# Patient Record
Sex: Male | Born: 1960 | Race: Black or African American | Hispanic: No | Marital: Single
Health system: Southern US, Academic
[De-identification: ages and names within clinical notes are randomized; demographics above are authoritative.]

## PROBLEM LIST (undated history)

## (undated) ENCOUNTER — Encounter: Attending: Family | Primary: Family

## (undated) ENCOUNTER — Ambulatory Visit: Attending: Family | Primary: Family

## (undated) ENCOUNTER — Encounter

## (undated) ENCOUNTER — Encounter: Payer: PRIVATE HEALTH INSURANCE | Attending: Family | Primary: Family

## (undated) ENCOUNTER — Encounter: Attending: Pediatrics | Primary: Pediatrics

## (undated) ENCOUNTER — Telehealth

## (undated) ENCOUNTER — Telehealth: Attending: Family | Primary: Family

## (undated) ENCOUNTER — Ambulatory Visit

## (undated) ENCOUNTER — Encounter
Attending: Pharmacist Clinician (PhC)/ Clinical Pharmacy Specialist | Primary: Pharmacist Clinician (PhC)/ Clinical Pharmacy Specialist

## (undated) ENCOUNTER — Telehealth
Attending: Pharmacist Clinician (PhC)/ Clinical Pharmacy Specialist | Primary: Pharmacist Clinician (PhC)/ Clinical Pharmacy Specialist

## (undated) ENCOUNTER — Ambulatory Visit: Payer: PRIVATE HEALTH INSURANCE

## (undated) ENCOUNTER — Encounter: Attending: Gastroenterology | Primary: Gastroenterology

## (undated) ENCOUNTER — Ambulatory Visit: Payer: PRIVATE HEALTH INSURANCE | Attending: Family | Primary: Family

## (undated) ENCOUNTER — Encounter: Payer: PRIVATE HEALTH INSURANCE | Attending: Gastroenterology | Primary: Gastroenterology

## (undated) DIAGNOSIS — M109 Gout, unspecified: Secondary | ICD-10-CM

## (undated) DIAGNOSIS — I10 Essential (primary) hypertension: Secondary | ICD-10-CM

## (undated) HISTORY — PX: CHOLECYSTECTOMY: SHX55

---

## 2000-01-19 ENCOUNTER — Encounter: Payer: Self-pay | Admitting: Neurology

## 2000-01-19 ENCOUNTER — Inpatient Hospital Stay (HOSPITAL_COMMUNITY): Admission: EM | Admit: 2000-01-19 | Discharge: 2000-01-22 | Payer: Self-pay | Admitting: Emergency Medicine

## 2000-01-20 ENCOUNTER — Encounter: Payer: Self-pay | Admitting: Pulmonary Disease

## 2000-01-21 ENCOUNTER — Encounter: Payer: Self-pay | Admitting: Neurology

## 2000-11-24 ENCOUNTER — Encounter: Payer: Self-pay | Admitting: Emergency Medicine

## 2000-11-24 ENCOUNTER — Inpatient Hospital Stay (HOSPITAL_COMMUNITY): Admission: EM | Admit: 2000-11-24 | Discharge: 2000-12-03 | Payer: Self-pay | Admitting: Emergency Medicine

## 2000-11-26 ENCOUNTER — Encounter: Payer: Self-pay | Admitting: Family Medicine

## 2017-10-31 ENCOUNTER — Emergency Department
Admission: EM | Admit: 2017-10-31 | Discharge: 2017-10-31 | Disposition: A | Discharge status: Home / Self Care | Payer: Self-pay | Attending: Student in an Organized Health Care Education/Training Program | Admitting: Student in an Organized Health Care Education/Training Program

## 2017-10-31 ENCOUNTER — Encounter: Payer: Self-pay | Admitting: *Deleted

## 2017-10-31 ENCOUNTER — Other Ambulatory Visit: Payer: Self-pay

## 2017-10-31 DIAGNOSIS — M1000 Idiopathic gout, unspecified site: Secondary | ICD-10-CM

## 2017-10-31 DIAGNOSIS — F17200 Nicotine dependence, unspecified, uncomplicated: Secondary | ICD-10-CM | POA: Insufficient documentation

## 2017-10-31 DIAGNOSIS — M10071 Idiopathic gout, right ankle and foot: Secondary | ICD-10-CM | POA: Insufficient documentation

## 2017-10-31 HISTORY — DX: Gout, unspecified: M10.9

## 2017-10-31 MED ORDER — COLCHICINE 0.6 MG PO TABS: 0.6000 mg | ORAL_TABLET | Freq: Every day | ORAL | 2 refills | Status: AC

## 2017-10-31 MED ORDER — PREDNISONE 10 MG PO TABS: ORAL_TABLET | ORAL | 0 refills | Status: DC

## 2017-10-31 NOTE — ED Provider Notes (Signed)
[facility] Emergency Department Provider Note  ____________________________________________   First MD Initiated Contact with Patient 10/31/17 1413     (approximate)  I have reviewed the triage vital signs and the nursing notes.   HISTORY  Chief Complaint Foot Pain    HPI Andrew Bradley is a 57 y.o. male presents emergency department complaining of right foot pain.  He states he has history of gout.  He recently moved here from [place] and does not have a regular doctor.  He states he did have medicine from [place] but is running out.  He thinks it is a purple pill.  He does not know the name.  He has been taken ibuprofen without any relief.  He denies fever or chills.  Eyes injury to the foot.    Past Medical History:  Diagnosis Date  . Gout     There are no active problems to display for this patient.   Past Surgical History:  Procedure Laterality Date  . CHOLECYSTECTOMY      Prior to Admission medications   Medication Sig Start Date End Date Taking? Authorizing Provider  colchicine 0.6 MG tablet Take 1 tablet (0.6 mg total) by mouth daily. 10/31/17 10/31/18  Jacquel Mccamish, Roselyn Bering, PA-C  predniSONE (DELTASONE) 10 MG tablet Take 6 pills on day 1 and decrease by 1 pill each day until all are gone.  Take these early in the morning. 10/31/17   Sherrie Mustache Roselyn Bering, PA-C    Allergies Patient has no known allergies.  History reviewed. No pertinent family history.  Social History Social History   Tobacco Use  . Smoking status: Current Every Day Smoker  Substance Use Topics  . Alcohol use: Never    Frequency: Never  . Drug use: Not on file    Review of Systems  Constitutional: No fever/chills Eyes: No visual changes. ENT: No sore throat. Respiratory: Denies cough Genitourinary: Negative for dysuria. Musculoskeletal: Negative for back pain.  Complains of right foot pain Skin: Negative for  rash.    ____________________________________________   PHYSICAL EXAM:  VITAL SIGNS: ED Triage Vitals  Enc Vitals Group     BP 10/31/17 1357 (!) 182/84     Pulse Rate 10/31/17 1357 66     Resp 10/31/17 1357 18     Temp 10/31/17 1357 98 F (36.7 C)     Temp Source 10/31/17 1357 Oral     SpO2 10/31/17 1357 98 %     Weight 10/31/17 1357 185 lb (83.9 kg)     Height 10/31/17 1357 5\' 8"  (1.727 m)     Head Circumference --      Peak Flow --      Pain Score 10/31/17 1356 8     Pain Loc --      Pain Edu? --      Excl. in GC? --     Constitutional: Alert and oriented. Well appearing and in no acute distress. Eyes: Conjunctivae are normal.  Head: Atraumatic. Nose: No congestion/rhinnorhea. Mouth/Throat: Mucous membranes are moist.   Neck:  supple no lymphadenopathy noted Cardiovascular: Normal rate, regular rhythm. Heart sounds are normal Respiratory: Normal respiratory effort.  No retractions, lungs c t a  Abd: soft nontender bs normal all 4 quad GU: deferred Musculoskeletal: FROM all extremities, warm and well perfused.  The right foot is swollen and tender.  More tenderness at the joint of the right great toe.  Area is slightly red.  Neurovascular is intact. Neurologic:  Normal  speech and language.  Skin:  Skin is warm, dry and intact. No rash noted. Psychiatric: Mood and affect are normal. Speech and behavior are normal.  ____________________________________________   LABS (all labs ordered are listed, but only abnormal results are displayed)  Labs Reviewed - No data to display ____________________________________________   ____________________________________________  RADIOLOGY    ____________________________________________   PROCEDURES  Procedure(s) performed: No  Procedures    ____________________________________________   INITIAL IMPRESSION / ASSESSMENT AND PLAN / ED COURSE  Pertinent labs & imaging results that were available during my care of  the patient were reviewed by me and considered in my medical decision making (see chart for details).   Patient is a 57 year old male presents emergency department complaining of right foot pain.  He has history of gout.  He states he is running out of his medication as he just recently moved here from [place].  Physical exam the right foot is swollen and tender at the joint of the right great toe.  There is slight redness noted to.  He is neurovascularly intact.  Remainder the exam is unremarkable  Discussed findings with patient.  Was given prescription for cold seen 0.6 mg daily, Sterapred 10 mg 6-day Dosepak.  He is to follow a low purine diet.  He states he understands comply with instructions.  He is to follow-up with acute care or [facility] if not better 5 7 days.  He states he understands was discharged in stable condition.     As part of my medical decision making, I reviewed the following data within the electronic MEDICAL RECORD NUMBER Nursing notes reviewed and incorporated, Notes from prior ED visits and [place] Controlled Substance Database  ____________________________________________   FINAL CLINICAL IMPRESSION(S) / ED DIAGNOSES  Final diagnoses:  Idiopathic gout, unspecified chronicity, unspecified site      NEW MEDICATIONS STARTED DURING THIS VISIT:  Discharge Medication List as of 10/31/2017  2:26 PM    START taking these medications   Details  colchicine 0.6 MG tablet Take 1 tablet (0.6 mg total) by mouth daily., Starting Wed 10/31/2017, Until Thu 10/31/2018, Print    predniSONE (DELTASONE) 10 MG tablet Take 6 pills on day 1 and decrease by 1 pill each day until all are gone.  Take these early in the morning., Print         Note:  This document was prepared using Dragon voice recognition software and may include unintentional dictation errors.    Faythe GheeFisher, Nakeesha Bowler W, PA-C 10/31/17 1732    Willy Eddyobinson, Patrick, MD 11/02/17 [phone]

## 2017-10-31 NOTE — ED Triage Notes (Signed)
R foot pain since Sunday. Alert, oriented, ambulatory. States hx gout.

## 2017-10-31 NOTE — Discharge Instructions (Addendum)
Follow-up with your regular doctor, acute care,, or [facility] if you are not better in 5 to 7 days.  Take medication as prescribed.  [facility] $66.59.  The prednisone is very inexpensive and is around $5

## 2018-04-16 ENCOUNTER — Other Ambulatory Visit: Payer: Self-pay | Admitting: Family Medicine

## 2018-04-16 ENCOUNTER — Other Ambulatory Visit (HOSPITAL_COMMUNITY): Payer: Self-pay | Admitting: Family Medicine

## 2018-04-16 DIAGNOSIS — B1920 Unspecified viral hepatitis C without hepatic coma: Secondary | ICD-10-CM

## 2018-04-25 ENCOUNTER — Ambulatory Visit
Admission: RE | Admit: 2018-04-25 | Discharge: 2018-04-25 | Disposition: A | Discharge status: Home / Self Care | Payer: BLUE CROSS/BLUE SHIELD | Source: Ambulatory Visit | Attending: Family Medicine | Admitting: Family Medicine

## 2018-04-25 DIAGNOSIS — B1920 Unspecified viral hepatitis C without hepatic coma: Secondary | ICD-10-CM | POA: Diagnosis present

## 2018-04-26 ENCOUNTER — Other Ambulatory Visit: Payer: Self-pay | Admitting: Family Medicine

## 2018-04-26 DIAGNOSIS — B1920 Unspecified viral hepatitis C without hepatic coma: Secondary | ICD-10-CM

## 2018-05-06 ENCOUNTER — Ambulatory Visit: Admission: RE | Admit: 2018-05-06 | Payer: BLUE CROSS/BLUE SHIELD | Source: Ambulatory Visit

## 2018-07-08 ENCOUNTER — Ambulatory Visit: Admit: 2018-07-08 | Discharge: 2018-07-09 | Payer: PRIVATE HEALTH INSURANCE | Attending: Family | Primary: Family

## 2018-07-08 DIAGNOSIS — R768 Other specified abnormal immunological findings in serum: Principal | ICD-10-CM

## 2018-07-08 DIAGNOSIS — M2550 Pain in unspecified joint: Secondary | ICD-10-CM

## 2018-07-08 DIAGNOSIS — Z0000 Encounter for general adult medical examination without abnormal findings: Secondary | ICD-10-CM

## 2018-07-08 DIAGNOSIS — I10 Essential (primary) hypertension: Secondary | ICD-10-CM

## 2018-07-08 MED ORDER — AMLODIPINE 10 MG TABLET
ORAL_TABLET | Freq: Every day | ORAL | 1 refills | 0 days | Status: CP
Start: 2018-07-08 — End: 2018-10-07

## 2018-08-01 ENCOUNTER — Other Ambulatory Visit: Payer: Self-pay | Admitting: Student

## 2018-08-01 DIAGNOSIS — R748 Abnormal levels of other serum enzymes: Secondary | ICD-10-CM

## 2018-08-01 DIAGNOSIS — R160 Hepatomegaly, not elsewhere classified: Secondary | ICD-10-CM

## 2018-08-01 DIAGNOSIS — B182 Chronic viral hepatitis C: Secondary | ICD-10-CM

## 2018-08-06 ENCOUNTER — Ambulatory Visit: Payer: BLUE CROSS/BLUE SHIELD

## 2018-08-14 ENCOUNTER — Other Ambulatory Visit: Payer: Self-pay

## 2018-08-14 ENCOUNTER — Ambulatory Visit
Admission: RE | Admit: 2018-08-14 | Discharge: 2018-08-14 | Disposition: A | Discharge status: Home / Self Care | Payer: BLUE CROSS/BLUE SHIELD | Source: Ambulatory Visit | Attending: Student | Admitting: Student

## 2018-08-14 DIAGNOSIS — R160 Hepatomegaly, not elsewhere classified: Secondary | ICD-10-CM | POA: Insufficient documentation

## 2018-08-14 DIAGNOSIS — B182 Chronic viral hepatitis C: Secondary | ICD-10-CM | POA: Diagnosis present

## 2018-08-14 DIAGNOSIS — R748 Abnormal levels of other serum enzymes: Secondary | ICD-10-CM | POA: Diagnosis present

## 2018-08-14 LAB — POCT I-STAT CREATININE: Creatinine, Ser: 1.3 mg/dL — ABNORMAL HIGH (ref 0.61–1.24)

## 2018-08-14 MED ORDER — GADOBUTROL 1 MMOL/ML IV SOLN
8.0000 mL | Freq: Once | INTRAVENOUS | Status: AC | PRN
  Administered 2018-08-14: 10:00:00 8 mL via INTRAVENOUS

## 2018-09-04 DIAGNOSIS — R1011 Right upper quadrant pain: Secondary | ICD-10-CM

## 2018-09-04 DIAGNOSIS — B182 Chronic viral hepatitis C: Principal | ICD-10-CM

## 2018-09-13 MED ORDER — PEG-ELECTROLYTE SOLUTION 420 GRAM ORAL SOLUTION
Freq: Once | ORAL | 0 refills | 0.00000 days | Status: CP
Start: 2018-09-13 — End: 2018-09-13

## 2018-09-25 LAB — FIBROSIS SCORE: Liver fibrosis score:Score:Pt:Ser/Plas:Qn:Calculated.FibroSure: 0.6 — ABNORMAL HIGH

## 2018-09-25 LAB — HCV LIVER FIBROSIS PANEL
ALT (SGPT): 38 IU/L (ref 0–55)
BILIRUBIN TOTAL (HP): 0.2 mg/dL (ref 0.0–1.2)
GGT (SENDOUT): 410 IU/L — ABNORMAL HIGH (ref 0–65)
HAPTOGLOBIN: 197 mg/dL (ref 29–370)

## 2018-09-25 MED ORDER — GLECAPREVIR 100 MG-PIBRENTASVIR 40 MG TABLET
Freq: Every day | ORAL | 1 refills | 28.00000 days | Status: CP
Start: 2018-09-25 — End: ?
  Filled 2018-10-02: qty 1, 28d supply, fill #0

## 2018-09-25 NOTE — Unmapped (Signed)
Per test claim for MAVYRET at the [facility], patient needs Medication Assistance Program for Prior Authorization.

## 2018-09-25 NOTE — Unmapped (Signed)
Reviewed results from 09/23/18 blood work. Advised F3 from Fibrosure and importance of screening for [facility] every 6 months. Pt's last MRI was 08/2018 and showed LR3 lesion which will need to be monitored closely. AFP 21.2 on 09/23/18 which is lower than that of 28.8 from 08/01/18. Advised will proceed with Mavyret approval. Reminded pt this may take some time and once approved medication will be mailed to him from Specialty pharmacy. Pt verbalized understanding.

## 2018-09-26 NOTE — Unmapped (Signed)
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 Advised indeed that is correct. She reports he made it sound like this was nothing to worry about and she was concerned that he would pick up drinking alcohol again. Advised I did not want to worry him of stage 3 findings but did stress to pt that will need continued HCC screening as well as need for treatment for HCV to prevent further fibrosis. Advised will also address alcohol at future calls/visits to reinforce that continued drinking can lead to cirrhosis. Advised HCV treatment request had been submitted to insurance and currently awaiting approval. Advised HCV medication will be mailed to pt once approval is received. She was also concerned about copay. Advised there are grants available to help with copay and in needed, will work with pt to obtain these. She thanked me for the assistance.

## 2018-10-01 NOTE — Unmapped (Signed)
[facility] Specialty Medication Referral: PA Approved      Medication (Brand/Generic): MAVYRET    Final Test Claim completed with resulted information below:    Patient ABLE to fill at [facility]  Insurance Company:  [employer]  Anticipated Copay: $0  Is anticipated copay with a copay card or grant? No, there is no need for grant or copay assistance.     Does this patient have to receive a partial fill of the medication due to insurance restrictions? NO  If so, please cofirm how many days supply is allowed per plan per fill and how long the patient will have to fill partial months supply for the medication: NOT APPLICABLE     If the copay is under the $25 defined limit, per policy there will be no further investigation of need for financial assistance at this time unless patient requests. This referral has been communicated to the provider and handed off to the [facility] team for further processing and filling of prescribed medication.   ______________________________________________________________________  Please utilize this referral for viewing purposes as it will serve as the central location for all relevant documentation and updates.

## 2018-10-01 NOTE — Unmapped (Signed)
[facility]   Patient Onboarding/Medication Counseling    Joseph Molina is a 58 y.o. male with hepatitis C who I am counseling today on initiation of therapy.  I am speaking to the patient.    Verified patient's date of birth / HIPAA.    Specialty medication(s) to be sent: Infectious Disease: Mavyret      Non-specialty medications/supplies to be sent: n/a      Medications not needed at this time: n/a         Mavyret (glecaprevir and pibrentasvir) 100mg /40mg     Planned Start Date: 10/03/2018    Has the patient been told to call and make a 4 week follow-up appointment at the liver clinic after their medicine has been received? Yes, Joseph Molina was told to call [phone], option 1, option 1      Medication & Administration     Dosage: Take three tablets by mouth once daily for 8 weeks.    Administration: Take with food.    Adherence/Missed dose instructions:   ? Take missed dose with food as soon as you think about it.   ? If it has been more than 18 hours since the missed dose, skip the missed dose and resume with your next scheduled dose.  ? Do not take extra doses or 2 doses at the same time.    Goals of Therapy     The goal of therapy is to cure the patient of Hepatitis C. Patients who have undetectable HCV RNA in the serum, as assessed by a sensitive polymerase chain reaction (PCR) assay, ?12 weeks after treatment completion are deemed to have achieved SVR (cure). (www.hcvguidelines.org)     Side Effects & Monitoring Parameters     Common Side Effects:  ? Headache  ? Fatigue   ? Nausea    The following side effects should be reported to the provider:  ? Signs of liver problems like dark urine, feeling tired, lack of hunger, upset stomach or stomach pain, light-colored stools, throwing up, or yellow skin/eyes.  ? Signs of an allergic reaction, such as rash; hives; itching; red, swollen, blistered, or peeling skin with or without fever.   ? If you have wheezing; tightness in the chest or throat; trouble breathing, swallowing, or talking; unusual hoarseness; or swelling of the mouth, face, lips, tongue, or throat, call 911 or go to the closest emergency department (ED).     Monitoring Parameters: (AASLD-IDSA Guidelines, 2019)    Within 6 months prior to starting treatment:  ? Complete blood count (CBC).  ? International normalized ratio (INR) if clinically indicated.   ? Hepatic function panel: albumin, total and direct bilirubin, ALT, AST, and Alkaline Phosphatase levels.  ? Calculated glomerular filtration rate (eGFR).  ? Pregnancy testing within 1 month prior to starting treatment in females of childbearing age  Any time prior to starting treatment:  ? Test for HCV genotype.  ? Assess for hepatic fibrosis.  ? Quantitative HCV RNA (HCV viral load).  ? Assess for active HBV coinfection: HBV surface antigen (HBsAg); If HBsAg positive, then should be assessed for whether HBV DNA level meets AASLD criteria for HBV treatment.  ? Assess for evidence of prior HBV infection and immunity: HBV core antibody (anti-HBc) and HBV surface antibody (anti-HBS).   ? Assess for HIV coinfection.    Monitoring during treatment:   ? Diabetics should monitor for signs of hypoglycemia.  ? Patients on warfarin should monitor for changes in INR.  ? Pregnancy  test monthly in females of childbearing age.  ? For HBsAg positive patients not already on HBV therapy because baseline HBV DNA level does not meet treatment criteria:   o Initiate prophylactic HBV antiviral therapy OR  o Monitor HBV DNA levels monthly during and immediately after Mavyret therapy.    After treatment to document a cure:   ? Quantitative HCV viral load 12 weeks after completion of therapy.    Contraindications, Warnings, & Precautions     Contraindications: Moderate or severe hepatic impairment (Child-Pugh class B or C); history of hepatic decompensation; coadministration with atazanavir or rifampin.    Disease-related concerns:   o Hepatitis B virus reactivation.   o Risk of hepatic decompensation/failure in patients with evidence of advanced liver disease.    Drug/Food Interactions     ? Medication list reviewed in Epic. The patient was instructed to inform the care team before taking any new medications or supplements. Counseled patient to NOT take colchicine while on Mavyret.   ? Encourage minimizing alcohol intake.  ? HMG-CoA Reductase Inhibitors: Use the lowest dose of statins possible. Monitor for signs of toxicity such as myopathy. Ask patient to self-report potential side effects of increased concentrations such as muscle pain.  o Avoid use with atorvastatin, simvastatin, or lovastatin.  o Limit rosuvastatin to a maximum of 10mg  daily.  o Reduce pravastatin dose by 50%.   ? Potential interactions: Clearance of HCV infection with direct acting antivirals may lead to changes in hepatic function, which may impact the safe and effective use of concomitant medications. Frequent monitoring of relevant laboratory parameters (e.g., International Normalized Ratio [INR] in patients taking warfarin, blood glucose levels in diabetic patients) or drug concentrations of concomitant medications such as cytochrome P450 substrates with a narrow therapeutic index (e.g. certain immunosuppressants) is recommended to ensure safe and effective use. Dose adjustments of concomitant medications may be necessary.    Storage, Handling Precautions, & Disposal     ? Store in the original container at room temperature.   ? Store in a dry place. Do not store in a bathroom.   ? Keep all drugs in a safe place. Keep all drugs out of the reach of children and pets.   ? Disposal: You should NOT have any Mavyret to throw away.      Current Medications (including OTC/herbals), Comorbidities and Allergies     Current Outpatient Medications   Medication Sig Dispense Refill   ??? amLODIPine (NORVASC) 10 MG tablet Take 1 tablet (10 mg total) by mouth daily. 90 tablet 1   ??? aspirin (ECOTRIN) 81 MG tablet ASPIRIN EC 81 MG TBEC     ??? colchicine (COLCRYS) 0.6 mg tablet Take 1 tablet by mouth daily.     ??? docusate sodium (COLACE) 100 MG capsule Take 200 mg by mouth daily.     ??? glecaprevir-pibrentasvir (MAVYRET) 100-40 mg tablet Take 3 tablets by mouth daily with food. 1 4-week carton 1   ??? naproxen (NAPROSYN) 500 MG tablet Take 500 mg by mouth 2 (two) times a day with meals.     ??? omeprazole (PRILOSEC) 40 MG capsule Take 40 mg by mouth daily as needed.       No current facility-administered medications for this visit.        No Known Allergies    Patient Active Problem List   Diagnosis   ??? Hypertension   ??? Encounter for general adult medical examination without abnormal findings   ??? Vision changes  Reviewed and up to date in Epic.    Appropriateness of Therapy     Is medication and dose appropriate based on diagnosis? Yes    Baseline Quality of Life Assessment      How many days over the past month did your Hepatitis C keep you from your normal activities? 0    Financial Information     Medication Assistance provided: Prior Authorization    Anticipated copay of $0.00 reviewed with patient. Verified delivery address.    Delivery Information     Scheduled delivery date: 10/02/2018    Expected start date: 10/03/2018    Medication will be delivered via Same Day Courier to the home address in [place].  This shipment will not require a signature.      Explained the services we provide at [facility] and that each month we would call to set up refills.  Stressed importance of returning phone calls so that we could ensure they receive their medications in time each month.  Informed patient that we should be setting up refills 7-10 days prior to when they will run out of medication.  A pharmacist will reach out to perform a clinical assessment periodically.  Informed patient that a welcome packet and a drug information handout will be sent.      Patient verbalized understanding of the above information as well as how to contact the pharmacy at [phone] option 4 with any questions/concerns.  The pharmacy is open Monday through Friday 8:30am-4:30pm.  A pharmacist is available 24/7 via pager to answer any clinical questions they may have.    Patient Specific Needs     ? Does the patient have any physical, cognitive, or cultural barriers? No    ? Patient prefers to have medications discussed with  Patient     ? Is the patient able to read and understand education materials at a high school level or above? Yes    ? Patient's primary language is  English     ? Is the patient high risk? No     ? Does the patient require a Care Management Plan? No     ? Does the patient require physician intervention or other additional services (i.e. nutrition, smoking cessation, social work)? No      Roderic Palau  [facility] Specialty Pharmacist

## 2018-10-02 MED FILL — MAVYRET 100 MG-40 MG TABLET: 28 days supply | Qty: 1 | Fill #0 | Status: AC

## 2018-10-02 NOTE — Unmapped (Signed)
Reason for call: Returning pt's call    Hep C  Genotype: 1a (07/08/18)  Treatment: Mavyret x 8 wks  Start date: 10/02/18  Fibrosis: 0.60/F3 per Fibrosure on 09/23/18    Pt had called and left a VM. I returned pt's call and he received his Mavyret today and started tx at 1:15 PM with lunch. Pt is planning on taking all 3 pills at the same time daily with lunch. Pt instructed to call with any questions, and not start on any new medications without talking to [place] CPP first. TW # 4 apt coordinated for 10/31/18 @ 9:30 am with Owens Shark, DNP via tele health.     Vertell Limber RN, BSN  Nursing Care Coordinator   Pharmacy Adult GI Medicine  [facility]  [address]   [place], [place] [address]  [phone]

## 2018-10-03 NOTE — Unmapped (Signed)
Reason for call: Returning pt's call  ??  Hep C  Genotype: 1a (07/08/18)  Treatment: Mavyret x 8 wks  Start date: 10/02/18  Fibrosis: 0.60/F3 per Fibrosure on 09/23/18  ??  Pt had called and left a VM. I returned pt's call at phone number [phone]. Pt said the doctor told him me might feel itchy from the medication. Notified pt that few patient's have c/o itching. Pt denies any itching at this time. Pt asked if it would be ok for him to take a benadryl tablet if he does become itchy. Notified pt it would be ok for him to take one 25 mg tablet daily if needed for itching. Notified pt we would want to know any side effects he may be experiencing so to please call and let us know. Pt thanked me for the follow up call.    Vertell Limber RN, BSN  Nursing Care Coordinator   Pharmacy Adult GI Medicine  [facility]  [address]   [place], [place] [address]  [phone]

## 2018-10-07 ENCOUNTER — Encounter: Admit: 2018-10-07 | Discharge: 2018-10-08 | Payer: PRIVATE HEALTH INSURANCE | Attending: Family | Primary: Family

## 2018-10-07 DIAGNOSIS — M109 Gout, unspecified: Secondary | ICD-10-CM

## 2018-10-07 DIAGNOSIS — B182 Chronic viral hepatitis C: Secondary | ICD-10-CM

## 2018-10-07 DIAGNOSIS — M2550 Pain in unspecified joint: Secondary | ICD-10-CM

## 2018-10-07 DIAGNOSIS — R351 Nocturia: Secondary | ICD-10-CM

## 2018-10-07 DIAGNOSIS — J339 Nasal polyp, unspecified: Secondary | ICD-10-CM

## 2018-10-07 DIAGNOSIS — Z0000 Encounter for general adult medical examination without abnormal findings: Secondary | ICD-10-CM

## 2018-10-07 DIAGNOSIS — I10 Essential (primary) hypertension: Principal | ICD-10-CM

## 2018-10-07 MED ORDER — TAMSULOSIN 0.4 MG CAPSULE
ORAL_CAPSULE | Freq: Every day | ORAL | 3 refills | 0 days | Status: CP
Start: 2018-10-07 — End: 2019-10-07

## 2018-10-07 MED ORDER — FLUTICASONE PROPIONATE 50 MCG/ACTUATION NASAL SPRAY,SUSPENSION
Freq: Every day | NASAL | 3 refills | 0 days | Status: CP
Start: 2018-10-07 — End: 2019-10-07

## 2018-10-07 MED ORDER — NAPROXEN 500 MG TABLET
ORAL_TABLET | Freq: Two times a day (BID) | ORAL | 3 refills | 0 days | Status: CP
Start: 2018-10-07 — End: ?

## 2018-10-07 MED ORDER — AMLODIPINE 10 MG TABLET
ORAL_TABLET | Freq: Every day | ORAL | 3 refills | 0.00000 days | Status: CP
Start: 2018-10-07 — End: 2018-10-25

## 2018-10-07 NOTE — Unmapped (Signed)
[facility] Note:  Joseph Molina, [place] [address]. Phone [phone]    10/07/2018    Patient Name:   Joseph Molina   With sister [facility], [phone]    MRN: [id number]    Demographics:    Age-  58 y.o.     Date of Birth-  Nov 15, 1960    Chief complaint (CC):    Chief Complaint   Patient presents with   ??? Hypertension     follow up   ??? Cyst     left wrist x 2 weeks     Assessment/Plan:    Pleasant 58 y.o. male with HTN, Left hand Ganglion Cyst/Arthritis, Nasal Polyp, Hx of Gout, and chronic Hep C (Tx by [facility] hepatology with Mavyret), advised injection of cyst, medication management, and wHepatology follow-up, see table.    30 minutes of face to face time, > 1/2 the office visit  on medication management, counseling, and coordination of care (HEP C and Hepatology referral). Medication adherence and barriers to the treatment plan have been addressed. Opportunities to optimize healthy behaviors have been discussed. Patient / caregiver voiced understandingneeds F/U visit.     Diagnosis ICD-10-CM Associated Orders   1. Essential hypertension I10 Pneumococcal polysaccharide vaccine 23-valent greater than or equal to 2yo subcutaneous/IM     amLODIPine (NORVASC) 10 MG tablet     tamsulosin (FLOMAX) 0.4 mg capsule   2. Gout, unspecified cause, unspecified chronicity, unspecified site M10.9 Pneumococcal polysaccharide vaccine 23-valent greater than or equal to 2yo subcutaneous/IM   3. Chronic hepatitis C (CMS-HCC) B18.2 Continue TX with Mavyret and F/U with [facility] Hepatology    Pneumococcal polysaccharide vaccine 23-valent greater than or equal to 2yo subcutaneous/IM   4. Arthralgia, unspecified joint M25.50 naproxen (NAPROSYN) 500 MG tablet   5. Nocturia R35.1 tamsulosin (FLOMAX) 0.4 mg capsule   6. Nasal polyp J33.9 fluticasone propionate (FLONASE) 50 mcg/actuation nasal spray   7. Healthcare maintenance Z00.00 Pneumococcal polysaccharide vaccine 23-valent greater than or equal to 2yo subcutaneous/IM    Advised PSA     Health Maintenance:   Health Maintenance Due   Topic Date Due   ??? Pneumococcal Vaccine (1 of 1 - PPSV23) 05/18/1978   ??? Zoster Vaccines (1 of 2) 05/18/2009     Reviewed:  Immunizations: TDaP (Updated), Influenza (due in fall), Pneumovax (Updated today), Shingles (ordered)  PSA testing-Due, ordered  Lab testing-completed  ECG-Completed, not sure when? No records found.  Colon Cancer screen- Due for Colonoscopy, discussed [place] location if not wanting to go to [facility].    Subjective:    History of present illness (HPI):       Joseph Molina is a 58 y.o. male for evaluation of HTN, nose bleeds, left wrist pain, F/U on Hep C, and Hx of Gout. Medical issues have been stable  since last visit. He has noted some elevated BP and increased urination at night.  Medications used in the past to treat these symptoms include Amlodipine and Naproxen. Additionally, patient reports (+) Hep C and treatment with Mavyret. Patient has not required recent emergency room treatment for these symptoms, and has not required recent hospitalization. Reports intermittent fatigue and right sided abdominal pain have improved.     Denies HA, Fever, chest pain, SOB, N/V/D, bowel or bladder issues, or swelling.     Relevant ROS: Reviewed 12 systems, positive findings listed, all others negative.    Pertinent Past Med Hx:    Past Medical History:   Diagnosis  Date   ??? Gout    ??? Hypertension        Medications:     Current Outpatient Medications:   ???  amLODIPine (NORVASC) 10 MG tablet, Take 1 tablet (10 mg total) by mouth daily., Disp: 90 tablet, Rfl: 3  ???  aspirin (ECOTRIN) 81 MG tablet, ASPIRIN EC 81 MG TBEC, Disp: , Rfl:   ???  docusate sodium (COLACE) 100 MG capsule, Take 200 mg by mouth daily., Disp: , Rfl:   ???  glecaprevir-pibrentasvir (MAVYRET) 100-40 mg tablet, Take 3 tablets by mouth daily with food., Disp: 1 4-week carton, Rfl: 1  ???  naproxen (NAPROSYN) 500 MG tablet, Take 1 tablet (500 mg total) by mouth 2 (two) times a day with meals., Disp: 60 tablet, Rfl: 3  ???  omeprazole (PRILOSEC) 40 MG capsule, Take 40 mg by mouth daily as needed., Disp: , Rfl:   ???  fluticasone propionate (FLONASE) 50 mcg/actuation nasal spray, 2 sprays by Each Nare route daily., Disp: 16 g, Rfl: 3  ???  tamsulosin (FLOMAX) 0.4 mg capsule, Take 1 capsule (0.4 mg total) by mouth daily., Disp: 90 capsule, Rfl: 3     Allergies:   No Known Allergies    Pertinent Social Hx and Habits: EMR reviewed.   Single, two children, Lives in [place]. Works as a Engineer, structural.  Smoker, HX of ETOH use    Pertinent Family Hx: EMR reviewed    Objective:      Vitals:    10/07/18 0835   BP: 142/80   BP Site: L Arm   BP Position: Sitting   BP Cuff Size: Large   Pulse: 58   Temp: 36.9 ??C (98.4 ??F)   TempSrc: Oral   SpO2: 97%   Weight: 88.7 kg (195 lb 8 oz)   Height: 172.7 cm (5' 8)        Physical Exam:    General Appearance: WDWN in NAD      Skin: W, D, I. Left hand/wrist has ganglion cyst  HEENT: PERRLA, EOMI, TM's clear, Left nare has polyp  Respiratory: Clear throughout  Cardio: RRR  Abdomen: Soft and non-tnder  Neurologic: Grossly intact  GU: No CVA tenderness  PSYCH: A & O X 4    Diagnostics:     Lab Report:   See Attached in Care Everywhere    Blood  2018/02/13 HCV VIRUS AB Positive HCV a... Hepatitis C Virus Ab  2018/02/13 HCVQ, IU/ML 6580000 [iU]/mL Hepatitis C Virus Quant,  IU/mL  2018/02/12 HEP C AB >11.0 0.0?  0.9  H hepatitis C antibody,  Serum  WBC 10.3  HGB 17.1  HCT 48.7    2018/02/11 C?LDL/C?HDL 3.1 ratio 0.0?  3.6  LDL/HDL ratio, serum  2018/02/11   LDL 165 mg/dL 1?61 H Cholesterol in LDL  Cholesterol 243    2018/02/12 TSH 1.090 u[iU]/mL 0.450?  4.500  thyroid stimulating  hormone, serum    2018/02/11 HGBA1C 5.4 % 4.8?  5.6  Hemoglobin  A1c/Hemoglobin.total in    2018/02/11 CHLORIDE 110 mmol/L 96?  106  H chloride, serum  2018/02/11 POTASSIUM 4.1 mmol/L 3.5?  5.2  potassium, serum  2018/02/11 SODIUM 143 mmol/L 134?  144  sodium, serum 2018/02/11 BUN/CREAT 15 9?20 urea nitrogen/creatinine  ratio, serum  2018/02/11 GFRAA 85 mL/min >59 Glomerular Filtration  rate African American  2018/02/11 GFR EST 74 mL/min >59 estimated glomerular  filtration rate  2018/02/11 CREATININE 1.10 mg/dL 0.96?  0.45  creatinine, serum  2018/02/11 BUN 16 mg/dL 4?09  urea nitrogen, blood  2018/02/11 GLUCOSE SER 82 mg/dL 52?84 blood glucose  1324/40/10 IMMATUREGRAN 0.0 X10E3/UL 0.0?  0.1  Absolute Immature  CLINICAL DATA: ??Chronic hepatitis-C.    EXAM:  ABDOMEN ULTRASOUND COMPLETE    COMPARISON: ??None.    FINDINGS:  Gallbladder: Surgically absent.    Common bile duct: Diameter: 3.3 mm    Liver: 9 x 9 x 8 mm oval, medium echotexture mass with a thin  surrounding echogenic rim in the right lobe. Otherwise, normal.  Portal vein is patent on color Doppler imaging with normal direction  of blood flow towards the liver.    IVC: No abnormality visualized.    Pancreas: Visualized portion unremarkable.    Spleen: Size and appearance within normal limits.    Right Kidney: Length: 10.9 cm. Normal echotexture. 2.1 cm upper pole  cyst. No hydronephrosis.    Left Kidney: Length: 11.0 cm. Echogenicity within normal limits. No  mass or hydronephrosis visualized.    Abdominal aorta: No aneurysm visualized.    Other findings: None.    IMPRESSION:  9 mm nonspecific right lobe liver mass. Further evaluation with pre  and postcontrast magnetic resonance imaging of the liver is  recommended.      Electronically Signed   ??By: Viviann Spare ??Azucena Kuba M.D.   ??On: 04/25/2018 09:48       Lab Results   Component Value Date    WBC 7.3 07/08/2018    HGB 15.7 07/08/2018    HCT 45.8 07/08/2018    PLT 225 07/08/2018       Lab Results   Component Value Date    NA 144 07/08/2018    K 4.6 07/08/2018    CL 112 (H) 07/08/2018    CO2 22.0 07/08/2018    BUN 13 07/08/2018    CREATININE 1.17 07/08/2018    GLU 95 07/08/2018    CALCIUM 10.0 07/08/2018       Lab Results   Component Value Date    BILITOT 0.6 07/08/2018    PROT 7.5 07/08/2018    ALBUMIN 4.0 07/08/2018    ALT 36 07/08/2018    AST 54 07/08/2018    ALKPHOS 137 (H) 07/08/2018       No results found for: LABPROT, INR, APTT    No results found for: A1C    Olena Leatherwood, NP    .

## 2018-10-07 NOTE — Unmapped (Signed)
Patient Education        Ganglions: Care Instructions  Your Care Instructions     A ganglion is a small sac, or cyst, filled with a clear fluid that is like jelly. A ganglion may look like a bump on the hand or wrist. It also can appear on your feet, ankles, knees, or shoulders. It is not cancer. A ganglion can grow out of the protective area, or capsule, around a joint. It also can grow on a tendon sheath, which covers the ropelike tendons that connect muscle to bone. A ganglion may hurt or cause numbness if it presses on a nerve.  Many ganglions do not need treatment, and they often go away on their own. But if a ganglion hurts, becomes larger, causes numbness, or limits your activity, your doctor may want to drain it with a needle and syringe or remove it with minor surgery.  Follow-up care is a key part of your treatment and safety. Be sure to make and go to all appointments, and call your doctor if you are having problems. It's also a good idea to know your test results and keep a list of the medicines you take.  How can you care for yourself at home?  ?? Wear a wrist or finger splint as directed by your doctor. It will keep your wrist or hand from moving and help reduce the fluid in the cyst. This may be all you need for the ganglion to shrink and go away.  ?? Do not smash a ganglion with a book or other heavy object. You may break a bone or otherwise injure your wrist by trying this folk remedy, and the ganglion may return anyway.  ?? Do not try to drain the fluid by poking the ganglion with a pin or any other sharp object. You could cause an infection.  When should you call for help?   Call your doctor now or seek immediate medical care if:  ?? You have signs of infection, such as:  ? Increased pain, swelling, warmth, or redness.  ? Red streaks leading from the cyst.  ? Pus draining from the cyst.  ? A fever.  Watch closely for changes in your health, and be sure to contact your doctor if:  ?? You have increasing pain.  ?? Your ganglion is getting larger.  ?? You still have pain or numbness from a ganglion.  Where can you learn more?  Go to [facility] at https://myuncchart.org  [employer] [facility] under [employer]. Enter [id number] in the search box to learn more about Ganglions: Care Instructions.  Current as of: June 03, 2018??????????????????????????????Content Version: 12.5  ?? 2006-2020 Healthwise[employer] Inc[employer].   Care instructions adapted under license by [facility]. If you have questions about a medical condition or this instruction, always ask your healthcare professional. Healthwise, Incorporated disclaims any warranty or liability for your use of this information.       Patient Education        Nasal Polyps: Care Instructions  Your Care Instructions  A nasal polyp is a lump of tissue that grows into the nasal passages. One or more polyps may block the nasal passages, making it hard for you to breathe. Nasal polyps also can reduce your sense of smell.  Your doctor may treat small polyps with nasal sprays or pills that contain corticosteroids. These are medicines that can reduce swelling. Nasal polyps can be a long-term problem. Surgery is sometimes needed to remove polyps.  Follow-up care is a key part of your treatment and safety. Be sure to make and go to all appointments, and call your doctor if you are having problems. It's also a good idea to know your test results and keep a list of the medicines you take.  How can you care for yourself at home?  ?? Take your medicines exactly as prescribed. Call your doctor if you think you are having a problem with your medicine.  ?? If you have asthma or allergies (or both), avoid pollen, dust, or other things to which you are allergic. Allergies make it harder to breathe.  ?? Use a humidifier to add moisture to your bedroom. This may keep the air moist and make it easier for you to breathe. Follow the directions for cleaning the machine.  When should you call for help?   Call your doctor now or seek immediate medical care if:  ?? You have symptoms of infection, such as:  ? Increased pain, swelling, warmth, or redness.  ? Red streaks leading from the area.  ? Pus draining from the area.  ? A fever.  Watch closely for changes in your health, and be sure to contact your doctor if:  ?? You do not get better as expected.  Where can you learn more?  Go to [facility] at https://myuncchart.org  [employer] under [employer]. Enter V032 in the search box to learn more about Nasal Polyps: Care Instructions.  Current as of: October 29, 2017??????????????????????????????Content Version: 12.5  ?? 2006-2020 Healthwise, Inc[employer].   Care instructions adapted under license by [facility]. If you have questions about a medical condition or this instruction, always ask your healthcare professional. Healthwise, Incorporated disclaims any warranty or liability for your use of this information.

## 2018-10-09 NOTE — Unmapped (Signed)
Patient referred for COVID related appointment.   declined

## 2018-10-09 NOTE — Unmapped (Signed)
Do you have any of the following conditions?   ? Heart or Lung condition: No  ? Hypertension: Yes  ? Diabetes: No  ? Congestive Heart Disease: No  ? COPD: No  ? Asthma: No  ? HIV: No  ? Chronic kidney disease (renal failure): No  ? Liver disease: Yes and Hep C  ? Cystic Fibrosis: No  ? Sickle Cell Anemia or other blood disorder: No  ? Neurologic disease that limits movement: No  ? Moderate to severe developmental delay: No  ? Usage of Immunosuppressant medications (used in transplant or Lupus patients): No  ? Usage of Chemotherapy medications: No  ? Long-term steroid usage: No      Women only  ? Are you currently pregnant? N/A male patient    COVID Testing  ? Have you been COVID tested: No  o If Yes: was it an antibody test: N/A  ? What was the date of your COVID test: N/A  ? What was the result of your COVID test: N/A    Reason for Disposition  ??? Information only question and nurse able to answer    Answer Assessment - Initial Assessment Questions  1. REASON FOR CALL or QUESTION: What is your reason for calling today? or How can I best help you? or What question do you have that I can help answer?      Patient called asking to be tested for COVID    Protocols used: INFORMATION ONLY CALL - NO TRIAGE-ADULT-[place]

## 2018-10-18 NOTE — Unmapped (Signed)
[facility] Clinical Assessment & Refill Coordination Note    Joseph Molina, DOB: [birth year]  Phone: [phone] (home)     All above HIPAA information was verified with patient.     Specialty Medication(s):   Infectious Disease: Mavyret     Current Outpatient Medications   Medication Sig Dispense Refill   ??? amLODIPine (NORVASC) 10 MG tablet Take 1 tablet (10 mg total) by mouth daily. 90 tablet 3   ??? aspirin (ECOTRIN) 81 MG tablet ASPIRIN EC 81 MG TBEC     ??? docusate sodium (COLACE) 100 MG capsule Take 200 mg by mouth daily.     ??? fluticasone propionate (FLONASE) 50 mcg/actuation nasal spray 2 sprays by Each Nare route daily. 16 g 3   ??? glecaprevir-pibrentasvir (MAVYRET) 100-40 mg tablet Take 3 tablets by mouth daily with food. 1 4-week carton 1   ??? naproxen (NAPROSYN) 500 MG tablet Take 1 tablet (500 mg total) by mouth 2 (two) times a day with meals. 60 tablet 3   ??? omeprazole (PRILOSEC) 40 MG capsule Take 40 mg by mouth daily as needed.     ??? tamsulosin (FLOMAX) 0.4 mg capsule Take 1 capsule (0.4 mg total) by mouth daily. 90 capsule 3     No current facility-administered medications for this visit.         Changes to medications: [name] reports no changes at this time.    No Known Allergies    Changes to allergies: No    SPECIALTY MEDICATION ADHERENCE   Mavyret  : 12 days of medicine on hand (includeds today's dose)        Specialty medication(s) dose(s) confirmed: Regimen is correct and unchanged.     Are there any concerns with adherence? No    Adherence counseling provided? Not needed    CLINICAL MANAGEMENT AND INTERVENTION      Clinical Benefit Assessment:    Do you feel the medicine is effective or helping your condition? Yes    Clinical Benefit counseling provided? Not needed    Adverse Effects Assessment:    Are you experiencing any side effects? No    Are you experiencing difficulty administering your medicine? No    Quality of Life Assessment:    How many days over the past month did your Hepatitis C  keep you from your normal activities? For example, brushing your teeth or getting up in the morning. 0    Have you discussed this with your provider? Not needed    Therapy Appropriateness:    Is therapy appropriate? Yes, therapy is appropriate and should be continued    DISEASE/MEDICATION-SPECIFIC INFORMATION      For Hepatitis C patients:  Regimen: Mavyret  x 8 weeks  Therapy start date: 10/02/2018  Completed Treatment Week #: 2  What time of day do you take your medicine? 12:30-1:00  Pill count over the phone revealed #36 (includes today's dose) tablets which is appropriate.    Are you taking any new OTC or herbal medication? No  Any alcoholic beverages? No  Do you have a follow up appointment? Yes, appointment is scheduled, patient is aware, and no identified barriers    PATIENT SPECIFIC NEEDS     ? Does the patient have any physical, cognitive, or cultural barriers? No    ? Is the patient high risk? No     ? Does the patient require a Care Management Plan? No     ? Does the patient require physician  intervention or other additional services (i.e. nutrition, smoking cessation, social work)? No      SHIPPING     Specialty Medication(s) to be Shipped:   Infectious Disease: Mavyret    Other medication(s) to be shipped: n/a     Changes to insurance: No    Delivery Scheduled: Yes, Expected medication delivery date: 10/23/2018.     Medication will be delivered via Next Day Courier to the confirmed home address in [facility].    The patient will receive a drug information handout for each medication shipped and additional FDA Medication Guides as required.  Verified that patient has previously received a Conservation officer, historic buildings.    All of the patient's questions and concerns have been addressed.    Roderic Palau   [facility] Specialty Pharmacist

## 2018-10-18 NOTE — Unmapped (Signed)
This patient has been disenrolled from the [facility] specialty pharmacy services due to therapy completion.    Joseph [facility]ina  [facility] Specialty Pharmacist

## 2018-10-22 MED FILL — MAVYRET 100 MG-40 MG TABLET: 28 days supply | Qty: 1 | Fill #1 | Status: AC

## 2018-10-22 MED FILL — MAVYRET 100 MG-40 MG TABLET: ORAL | 28 days supply | Qty: 1 | Fill #1

## 2018-10-25 ENCOUNTER — Ambulatory Visit: Admit: 2018-10-25 | Discharge: 2018-10-26 | Payer: PRIVATE HEALTH INSURANCE | Attending: Family | Primary: Family

## 2018-10-25 DIAGNOSIS — M67432 Ganglion, left wrist: Principal | ICD-10-CM

## 2018-10-25 DIAGNOSIS — J330 Polyp of nasal cavity: Secondary | ICD-10-CM

## 2018-10-25 DIAGNOSIS — I10 Essential (primary) hypertension: Secondary | ICD-10-CM

## 2018-10-25 MED ORDER — OMEPRAZOLE 40 MG CAPSULE,DELAYED RELEASE
ORAL_CAPSULE | Freq: Every day | ORAL | 3 refills | 90 days | Status: CP | PRN
Start: 2018-10-25 — End: ?

## 2018-10-25 MED ORDER — AMLODIPINE 10 MG TABLET
ORAL_TABLET | Freq: Every day | ORAL | 3 refills | 90.00000 days | Status: CP
Start: 2018-10-25 — End: ?

## 2018-10-31 ENCOUNTER — Encounter: Admit: 2018-10-31 | Discharge: 2018-11-01 | Payer: PRIVATE HEALTH INSURANCE | Attending: Family | Primary: Family

## 2018-10-31 DIAGNOSIS — B182 Chronic viral hepatitis C: Secondary | ICD-10-CM

## 2018-10-31 DIAGNOSIS — K740 Hepatic fibrosis: Principal | ICD-10-CM

## 2018-10-31 DIAGNOSIS — R772 Abnormality of alphafetoprotein: Secondary | ICD-10-CM

## 2018-12-11 ENCOUNTER — Encounter: Admit: 2018-12-11 | Discharge: 2018-12-11 | Payer: PRIVATE HEALTH INSURANCE

## 2018-12-11 DIAGNOSIS — B182 Chronic viral hepatitis C: Secondary | ICD-10-CM

## 2018-12-24 ENCOUNTER — Ambulatory Visit: Admit: 2018-12-24 | Discharge: 2018-12-25 | Payer: PRIVATE HEALTH INSURANCE

## 2018-12-28 DIAGNOSIS — J339 Nasal polyp, unspecified: Secondary | ICD-10-CM

## 2018-12-29 MED ORDER — FLUTICASONE PROPIONATE 50 MCG/ACTUATION NASAL SPRAY,SUSPENSION
Freq: Every day | NASAL | 1 refills | 0 days | Status: CP
Start: 2018-12-29 — End: 2019-12-29

## 2019-01-19 ENCOUNTER — Encounter: Payer: Self-pay | Admitting: Emergency Medicine

## 2019-01-19 ENCOUNTER — Ambulatory Visit
Admission: EM | Admit: 2019-01-19 | Discharge: 2019-01-19 | Disposition: A | Discharge status: Home / Self Care | Payer: BLUE CROSS/BLUE SHIELD | Attending: Family Medicine | Admitting: Family Medicine

## 2019-01-19 ENCOUNTER — Other Ambulatory Visit: Payer: Self-pay

## 2019-01-19 DIAGNOSIS — S46819A Strain of other muscles, fascia and tendons at shoulder and upper arm level, unspecified arm, initial encounter: Secondary | ICD-10-CM | POA: Diagnosis not present

## 2019-01-19 DIAGNOSIS — M542 Cervicalgia: Secondary | ICD-10-CM

## 2019-01-19 DIAGNOSIS — S46812A Strain of other muscles, fascia and tendons at shoulder and upper arm level, left arm, initial encounter: Secondary | ICD-10-CM

## 2019-01-19 HISTORY — DX: Essential (primary) hypertension: I10

## 2019-01-19 MED ORDER — PREDNISONE 10 MG PO TABS: ORAL_TABLET | ORAL | 0 refills | Status: AC

## 2019-01-19 MED ORDER — CYCLOBENZAPRINE HCL 10 MG PO TABS: 10.0000 mg | ORAL_TABLET | Freq: Two times a day (BID) | ORAL | 0 refills | Status: DC | PRN

## 2019-01-19 NOTE — ED Triage Notes (Signed)
Patient c/o neck pain and stiffness that has been going on for a week.  Patient states that he is out of his pain medicine. Patient denies recent injury.

## 2019-01-19 NOTE — Discharge Instructions (Addendum)
Take medication as prescribed. Rest. Drink plenty of fluids. Alternate heat and ice. Stretch.   Follow up with your primary care physician this week.  Return to Urgent care for new or worsening concerns.

## 2019-01-19 NOTE — ED Provider Notes (Signed)
[facility] ____________________________________________  Time seen: Approximately 11:48 AM  I have reviewed the triage vital signs and the nursing notes.  HISTORY  Chief Complaint Neck Pain  HPI Andrew Bradley is a 58 y.o. male presenting for evaluation of neck pain present for the last 1 week.  Patient states he woke up with the pain and it has continued.  Patient states pain is minimal to none if he is sitting completely still.  States pain is mostly with position changes, particularly going from lying to sitting up and changing close.  States pain is to his neck but does have pain with lifting both shoulders, stating it causes pain to the neck.  Denies any fall, direct injury or direct trauma.  Reports a few years ago he had similar pain after heavy lifting that eventually resolved.  Denies paresthesias, pain radiation, chest pain or shortness of breath.  No pain radiation to arms.  Has been taking his naproxen intermittently at home which she reports does help.  Patient states last night he had a hard time getting comfortable to sleep.  No recent sickness.  Reports otherwise doing well.  Does report he assist in helps lift his brother-in-law during the week and unsure if he did something during this.  Burman FreestoneJohnson, Michelle C, PA-C: PCP    Past Medical History:  Diagnosis Date  . Gout   . Hypertension     There are no active problems to display for this patient.   Past Surgical History:  Procedure Laterality Date  . CHOLECYSTECTOMY       No current facility-administered medications for this encounter.   Current Outpatient Medications:  .  amLODipine (NORVASC) 10 MG tablet, , Disp: , Rfl:  .  aspirin EC 81 MG tablet, ASPIRIN EC 81 MG TBEC, Disp: , Rfl:  .  colchicine 0.6 MG tablet, Take 1 tablet (0.6 mg total) by mouth daily., Disp: 30 tablet, Rfl: 2 .  fluticasone (FLONASE) 50 MCG/ACT nasal spray, 2 sprays by Each Nare route daily., Disp: , Rfl:  .  naproxen  (NAPROSYN) 500 MG tablet, Take by mouth., Disp: , Rfl:  .  omeprazole (PRILOSEC) 40 MG capsule, Take by mouth., Disp: , Rfl:  .  tamsulosin (FLOMAX) 0.4 MG CAPS capsule, , Disp: , Rfl:  .  cyclobenzaprine (FLEXERIL) 10 MG tablet, Take 1 tablet (10 mg total) by mouth 2 (two) times daily as needed for muscle spasms. Do not drive while taking as can cause drowsiness, Disp: 15 tablet, Rfl: 0 .  predniSONE (DELTASONE) 10 MG tablet, Start 60 mg po day one, then 50 mg po day two, taper by 10 mg daily until complete., Disp: 21 tablet, Rfl: 0  Allergies Patient has no known allergies.  Family History  Problem Relation Age of Onset  . Hypertension Mother     Social History Social History   Tobacco Use  . Smoking status: Current Every Day Smoker    Types: Cigarettes  . Smokeless tobacco: Never Used  Substance Use Topics  . Alcohol use: Never    Frequency: Never  . Drug use: Never    Review of Systems Constitutional: No fever ENT: No sore throat. Cardiovascular: Denies chest pain. Respiratory: Denies shortness of breath. Gastrointestinal: No abdominal pain.  No nausea, no vomiting.  Genitourinary: Negative for dysuria. Musculoskeletal: Positive neck pain. Negative for back pain. Skin: Negative for rash. Neurological: Negative for headaches, focal weakness or numbness.    ____________________________________________   PHYSICAL EXAM:  VITAL SIGNS:  ED Triage Vitals  Enc Vitals Group     BP 01/19/19 1113 134/77     Pulse Rate 01/19/19 1113 60     Resp 01/19/19 1113 16     Temp 01/19/19 1113 98.2 F (36.8 C)     Temp Source 01/19/19 1113 Oral     SpO2 01/19/19 1113 100 %     Weight 01/19/19 1108 193 lb (87.5 kg)     Height 01/19/19 1108 5\' 8"  (1.727 m)     Head Circumference --      Peak Flow --      Pain Score 01/19/19 1108 9     Pain Loc --      Pain Edu? --      Excl. in [place]? --     Constitutional: Alert and oriented. Well appearing and in no acute distress. Eyes:  Conjunctivae are normal. ENT      Head: Normocephalic and atraumatic. Neck: No stridor. Supple without meningismus.  Hematological/Lymphatic/Immunilogical: No cervical lymphadenopathy. Cardiovascular: Normal rate, regular rhythm. Grossly normal heart sounds.  Good peripheral circulation. Respiratory: Normal respiratory effort without tachypnea nor retractions. Breath sounds are clear and equal bilaterally. No wheezes, rales, rhonchi. Musculoskeletal: No midline thoracic or lumbar tenderness to palpation. Bilateral distal radial pulses equal and easily palpated. Except: Minimal midline cervical tenderness palpation, moderate tenderness to palpation left trapezius with palpable muscle spasm with right and left cervical rotation, minimal pain with cervical flexion and extension, pain to left trapezius with left shoulder abduction but full range of motion present to left shoulder, no paresthesias, no rash, no edema. Neurologic:  Normal speech and language. No gross focal neurologic deficits are appreciated. Speech is normal. No gait instability.  Skin:  Skin is warm, dry and intact. No rash noted. Psychiatric: Mood and affect are normal. Speech and behavior are normal. Patient exhibits appropriate insight and judgment   ___________________________________________   LABS (all labs ordered are listed, but only abnormal results are displayed)  Labs Reviewed - No data to display  PROCEDURES Procedures    INITIAL IMPRESSION / ASSESSMENT AND PLAN / ED COURSE  Pertinent labs & imaging results that were available during my care of the patient were reviewed by me and considered in my medical decision making (see chart for details).  Well-appearing patient.  No acute distress.  Neck pain and left trapezius pain for the last 1 week.  Declines pain at rest.  Reports pain is fully reproducible by exam in room with direct palpation as well as movement.  Denies chest pain or shortness of breath.  Suspect  musculoskeletal pain.  Has been taken naproxen.  Will treat with prednisone and Flexeril.  Encourage stretching, ice, heat and monitoring.  Discussed indication, risks and benefits of medications with patient.  Discussed follow up with Primary care physician this week. Discussed follow up and return parameters including no resolution or any worsening concerns. Patient verbalized understanding and agreed to plan.   ____________________________________________   FINAL CLINICAL IMPRESSION(S) / ED DIAGNOSES  Final diagnoses:  Neck pain  Strain of left trapezius muscle, initial encounter     ED Discharge Orders         Ordered    predniSONE (DELTASONE) 10 MG tablet     01/19/19 1142    cyclobenzaprine (FLEXERIL) 10 MG tablet  2 times daily PRN     01/19/19 1142           Note: This dictation was prepared with Dragon dictation along with smaller  Company secretary. Any transcriptional errors that result from this process are unintentional.         Marylene Land, NP 01/19/19 1201

## 2019-01-22 ENCOUNTER — Encounter: Admit: 2019-01-22 | Discharge: 2019-01-23 | Payer: PRIVATE HEALTH INSURANCE | Attending: Family | Primary: Family

## 2019-01-22 DIAGNOSIS — K5909 Other constipation: Principal | ICD-10-CM

## 2019-01-22 DIAGNOSIS — Z23 Encounter for immunization: Principal | ICD-10-CM

## 2019-01-22 DIAGNOSIS — I10 Essential (primary) hypertension: Principal | ICD-10-CM

## 2019-01-22 DIAGNOSIS — F418 Other specified anxiety disorders: Principal | ICD-10-CM

## 2019-01-22 MED ORDER — HYDROXYZINE HCL 25 MG TABLET: tablet | Freq: Four times a day (QID) | 3 refills | 4 days | Status: AC

## 2019-01-22 MED ORDER — COLCHICINE 0.6 MG TABLET: 1 mg | tablet | Freq: Every day | 3 refills | 30 days | Status: AC

## 2019-01-22 MED ORDER — SILDENAFIL 50 MG TABLET: 50 mg | tablet | Freq: Every day | 3 refills | 10 days | Status: AC

## 2019-01-25 MED ORDER — AMLODIPINE 10 MG TABLET
ORAL_TABLET | Freq: Every day | ORAL | 3 refills | 90 days | Status: CP
Start: 2019-01-25 — End: ?

## 2019-01-27 MED ORDER — ALLOPURINOL 100 MG TABLET: 100 mg | tablet | Freq: Every day | 1 refills | 90 days | Status: AC

## 2019-01-27 MED ORDER — ALLOPURINOL 100 MG TABLET
ORAL_TABLET | Freq: Every day | ORAL | 6 refills | 30.00000 days | Status: CP
Start: 2019-01-27 — End: 2019-01-27

## 2019-06-02 ENCOUNTER — Encounter: Admit: 2019-06-02 | Discharge: 2019-06-03 | Payer: PRIVATE HEALTH INSURANCE | Attending: Family | Primary: Family

## 2019-06-02 DIAGNOSIS — I10 Essential (primary) hypertension: Principal | ICD-10-CM

## 2019-06-02 MED ORDER — AMLODIPINE 10 MG TABLET
ORAL_TABLET | Freq: Every day | ORAL | 3 refills | 90.00000 days | Status: CP
Start: 2019-06-02 — End: ?

## 2019-06-02 MED ORDER — TAMSULOSIN 0.4 MG CAPSULE
ORAL_CAPSULE | Freq: Every day | ORAL | 3 refills | 90 days | Status: CP
Start: 2019-06-02 — End: 2020-06-01

## 2019-07-03 ENCOUNTER — Other Ambulatory Visit: Payer: Self-pay

## 2019-07-03 ENCOUNTER — Emergency Department: Payer: BLUE CROSS/BLUE SHIELD

## 2019-07-03 ENCOUNTER — Emergency Department
Admission: EM | Admit: 2019-07-03 | Discharge: 2019-07-03 | Disposition: A | Discharge status: Home / Self Care | Payer: BLUE CROSS/BLUE SHIELD | Attending: Emergency Medicine | Admitting: Emergency Medicine

## 2019-07-03 DIAGNOSIS — Y999 Unspecified external cause status: Secondary | ICD-10-CM | POA: Diagnosis not present

## 2019-07-03 DIAGNOSIS — S39012A Strain of muscle, fascia and tendon of lower back, initial encounter: Secondary | ICD-10-CM | POA: Diagnosis not present

## 2019-07-03 DIAGNOSIS — I10 Essential (primary) hypertension: Secondary | ICD-10-CM | POA: Insufficient documentation

## 2019-07-03 DIAGNOSIS — Y939 Activity, unspecified: Secondary | ICD-10-CM | POA: Diagnosis not present

## 2019-07-03 DIAGNOSIS — Z7982 Long term (current) use of aspirin: Secondary | ICD-10-CM | POA: Diagnosis not present

## 2019-07-03 DIAGNOSIS — S3992XA Unspecified injury of lower back, initial encounter: Secondary | ICD-10-CM | POA: Diagnosis present

## 2019-07-03 DIAGNOSIS — Y929 Unspecified place or not applicable: Secondary | ICD-10-CM | POA: Diagnosis not present

## 2019-07-03 DIAGNOSIS — X501XXA Overexertion from prolonged static or awkward postures, initial encounter: Secondary | ICD-10-CM | POA: Diagnosis not present

## 2019-07-03 DIAGNOSIS — F17210 Nicotine dependence, cigarettes, uncomplicated: Secondary | ICD-10-CM | POA: Diagnosis not present

## 2019-07-03 MED ORDER — NAPROXEN 250 MG PO TABS: 500.0000 mg | ORAL_TABLET | Freq: Two times a day (BID) | ORAL | 0 refills | Status: AC

## 2019-07-03 MED ORDER — KETOROLAC TROMETHAMINE 30 MG/ML IJ SOLN
30.0000 mg | Freq: Once | INTRAMUSCULAR | Status: AC
  Administered 2019-07-03: 30 mg via INTRAMUSCULAR
  Filled 2019-07-03: qty 1

## 2019-07-03 MED ORDER — ORPHENADRINE CITRATE 30 MG/ML IJ SOLN
60.0000 mg | Freq: Once | INTRAMUSCULAR | Status: AC
  Administered 2019-07-03: 60 mg via INTRAMUSCULAR
  Filled 2019-07-03: qty 2

## 2019-07-03 MED ORDER — CYCLOBENZAPRINE HCL 10 MG PO TABS: 10.0000 mg | ORAL_TABLET | Freq: Three times a day (TID) | ORAL | 0 refills | Status: AC | PRN

## 2019-07-03 NOTE — ED Notes (Signed)
Pt has lower back pain on the left side. Pt states he's had trouble walking since he felt it 'pop'. Left lower back is painful upon palpation.

## 2019-07-03 NOTE — ED Triage Notes (Signed)
Patient to ED via EMS. Reports he twisted this morning and heard a pop. Now complaining of pain in left lower back. Worse with laying flat.

## 2019-07-03 NOTE — ED Provider Notes (Signed)
[facility] Emergency Department Provider Note ____________________________________________  Time seen: Approximately 5:16 PM  I have reviewed the triage vital signs and the nursing notes.  HISTORY  Chief Complaint Back Pain   HPI Andrew Bradley is a 59 y.o. male presents to the emergency department for treatment and evaluation of back pain. He states that this morning, he bent over to pick up a breakfast tray and when he stood up he felt a pop on the left side of his back. No alleviating measures prior to arrival.   Past Medical History:  Diagnosis Date  . Gout   . Hypertension     There are no problems to display for this patient.   Past Surgical History:  Procedure Laterality Date  . CHOLECYSTECTOMY      Prior to Admission medications   Medication Sig Start Date End Date Taking? Authorizing Provider  amLODipine (NORVASC) 10 MG tablet  01/01/19   [provider]  aspirin EC 81 MG tablet ASPIRIN EC 81 MG TBEC 02/11/18   [provider]  colchicine 0.6 MG tablet Take 1 tablet (0.6 mg total) by mouth daily. 10/31/17 01/19/19  Fisher, Roselyn Bering, PA-C  cyclobenzaprine (FLEXERIL) 10 MG tablet Take 1 tablet (10 mg total) by mouth 3 (three) times daily as needed. 07/03/19   Sadie Hazelett B, FNP  fluticasone (FLONASE) 50 MCG/ACT nasal spray 2 sprays by Each Nare route daily. 12/29/18 12/29/19  [provider]  naproxen (NAPROSYN) 250 MG tablet Take 2 tablets (500 mg total) by mouth 2 (two) times daily with a meal. 07/03/19   Casondra Gasca B, FNP  omeprazole (PRILOSEC) 40 MG capsule Take by mouth. 08/15/18   [provider]  predniSONE (DELTASONE) 10 MG tablet Start 60 mg po day one, then 50 mg po day two, taper by 10 mg daily until complete. 01/19/19   Renford Dills, NP  tamsulosin [facility]) 0.4 MG CAPS capsule  01/01/19   [provider]    Allergies Patient has no known allergies.  Family History  Problem  Relation Age of Onset  . Hypertension Mother     Social History Social History   Tobacco Use  . Smoking status: Current Every Day Smoker    Types: Cigarettes  . Smokeless tobacco: Never Used  Substance Use Topics  . Alcohol use: Never  . Drug use: Never    Review of Systems Constitutional: Well appearing. Respiratory: Negative for dyspnea. Cardiovascular: Negative for change in skin temperature or color. Musculoskeletal:   Negative for chronic steroid use   Negative for trauma in the presence of osteoporosis  Negative for age over 11 and trauma.  Negative for constitutional symptoms, or history of cancer  Negative for pain worse at night. Skin: Negative for rash, lesion, or wound.  Genitourinary: Negative for urinary retention. Rectal: Negative for fecal incontinence or new onset constipation/bowel habit changes. Hematological/Immunilogical: Negative for immunosuppression, IV drug use, or fever Neurological: Negative for burning, tingling, numb, electric, radiating pain in the extremities.                        Negative for saddle anesthesia.                        Negative for focal neurologic deficit, progressive or disabling symptoms             Negative for saddle anesthesia. ____________________________________________   PHYSICAL EXAM:  VITAL SIGNS: ED  Triage Vitals  Enc Vitals Group     BP 07/03/19 1406 130/73     Pulse Rate 07/03/19 1406 72     Resp 07/03/19 1406 18     Temp 07/03/19 1406 97.7 F (36.5 C)     Temp Source 07/03/19 1406 Oral     SpO2 07/03/19 1406 99 %     Weight 07/03/19 1404 195 lb (88.5 kg)     Height 07/03/19 1404 5\' 8"  (1.727 m)     Head Circumference --      Peak Flow --      Pain Score 07/03/19 1404 10     Pain Loc --      Pain Edu? --      Excl. in [place]? --     Constitutional: Alert and oriented. Well appearing and in no acute distress. Eyes: Conjunctivae are clear without discharge or drainage.  Head: Atraumatic. Neck: Full,  active range of motion. Respiratory: Respirations even and unlabored. Musculoskeletal: Full ROM of the back and extremities, Strength 5/5 of the lower extremities as tested. Neurologic: Reflexes of the lower extremities are 2+. Negative straight leg raise on the right or left side. Skin: Atraumatic.  Psychiatric: Behavior and affect are normal.  ____________________________________________   LABS (all labs ordered are listed, but only abnormal results are displayed)  Labs Reviewed - No data to display ____________________________________________  RADIOLOGY  Image of the lumbar spine is negative for acute findings. ____________________________________________   PROCEDURES  Procedure(s) performed:  Procedures ____________________________________________   INITIAL IMPRESSION / ASSESSMENT AND PLAN / ED COURSE  BUCKY Bradley is a 59 y.o. male presents to the emergency department for treatment and evaluation of left lower back pain.  See HPI for further details.  Patient denies previous similar symptoms.  Exam and imaging are reassuring.  Patient is able to ambulate.  He will be treated with Toradol and Norflex while here.  He will be discharged home with prescriptions for Flexeril and Naprosyn and advised to follow-up with his primary care provider if her symptoms are not improving over the week.  He was encouraged to return to the emergency department for symptoms of change or worsen if unable to schedule an appointment.  Medications  ketorolac (TORADOL) 30 MG/ML injection 30 mg (30 mg Intramuscular Given 07/03/19 1724)  orphenadrine (NORFLEX) injection 60 mg (60 mg Intramuscular Given 07/03/19 1724)    ED Discharge Orders         Ordered    naproxen (NAPROSYN) 250 MG tablet  2 times daily with meals     07/03/19 1741    cyclobenzaprine (FLEXERIL) 10 MG tablet  3 times daily PRN     07/03/19 1741           Pertinent labs & imaging results that were available during my  care of the patient were reviewed by me and considered in my medical decision making (see chart for details).  _________________________________________   FINAL CLINICAL IMPRESSION(S) / ED DIAGNOSES  Final diagnoses:  Acute myofascial strain of lumbar region, initial encounter     If controlled substance prescribed during this visit, 12 month history viewed on the [place] prior to issuing an initial prescription for Schedule II or III opiod.   Victorino Dike, FNP 07/03/19 Einar Grad, MD 07/03/19 2123

## 2019-07-03 NOTE — ED Triage Notes (Signed)
Pt comes via [facility] from home with c/o left lower back pain. Pt states he was getting his brothers breakfast ready this am and twisted and heard a pop.

## 2019-07-28 MED ORDER — ALLOPURINOL 100 MG TABLET
ORAL_TABLET | Freq: Every day | ORAL | 1 refills | 90 days | Status: CP
Start: 2019-07-28 — End: 2020-07-27

## 2019-09-23 ENCOUNTER — Ambulatory Visit: Admit: 2019-09-23 | Payer: PRIVATE HEALTH INSURANCE | Attending: Family | Primary: Family

## 2019-11-09 DIAGNOSIS — J339 Nasal polyp, unspecified: Principal | ICD-10-CM

## 2019-11-10 MED ORDER — FLUTICASONE PROPIONATE 50 MCG/ACTUATION NASAL SPRAY,SUSPENSION
1 refills | 0 days | Status: CP
Start: 2019-11-10 — End: ?

## 2019-11-30 MED ORDER — OMEPRAZOLE 40 MG CAPSULE,DELAYED RELEASE
ORAL_CAPSULE | Freq: Every day | ORAL | 3 refills | 0.00000 days | PRN
Start: 2019-11-30 — End: ?

## 2019-12-01 MED ORDER — OMEPRAZOLE 40 MG CAPSULE,DELAYED RELEASE
ORAL_CAPSULE | Freq: Every day | ORAL | 0 refills | 90.00000 days | Status: CP | PRN
Start: 2019-12-01 — End: 2020-11-30

## 2020-03-08 MED ORDER — SILDENAFIL 50 MG TABLET
ORAL_TABLET | 9 refills | 0 days | Status: CP
Start: 2020-03-08 — End: ?

## 2020-06-03 DIAGNOSIS — I10 Essential (primary) hypertension: Principal | ICD-10-CM

## 2020-06-03 MED ORDER — TAMSULOSIN 0.4 MG CAPSULE
ORAL_CAPSULE | 3 refills | 0 days
Start: 2020-06-03 — End: ?

## 2020-06-22 DIAGNOSIS — R160 Hepatomegaly, not elsewhere classified: Principal | ICD-10-CM

## 2020-06-23 DIAGNOSIS — I10 Essential (primary) hypertension: Principal | ICD-10-CM

## 2020-06-23 MED ORDER — TAMSULOSIN 0.4 MG CAPSULE
ORAL_CAPSULE | Freq: Every day | ORAL | 3 refills | 90 days | Status: CP
Start: 2020-06-23 — End: ?

## 2020-12-25 IMAGING — CR DG LUMBAR SPINE 2-3V
3 series · 3 of 3 positions shown · non-contrast
Comparison: None.

CLINICAL DATA: Back pain since this morning. Twisted this morning
and heard a pop. Pain in left low back.

EXAM:
LUMBAR SPINE - 2-3 VIEW

[l-spine ap]
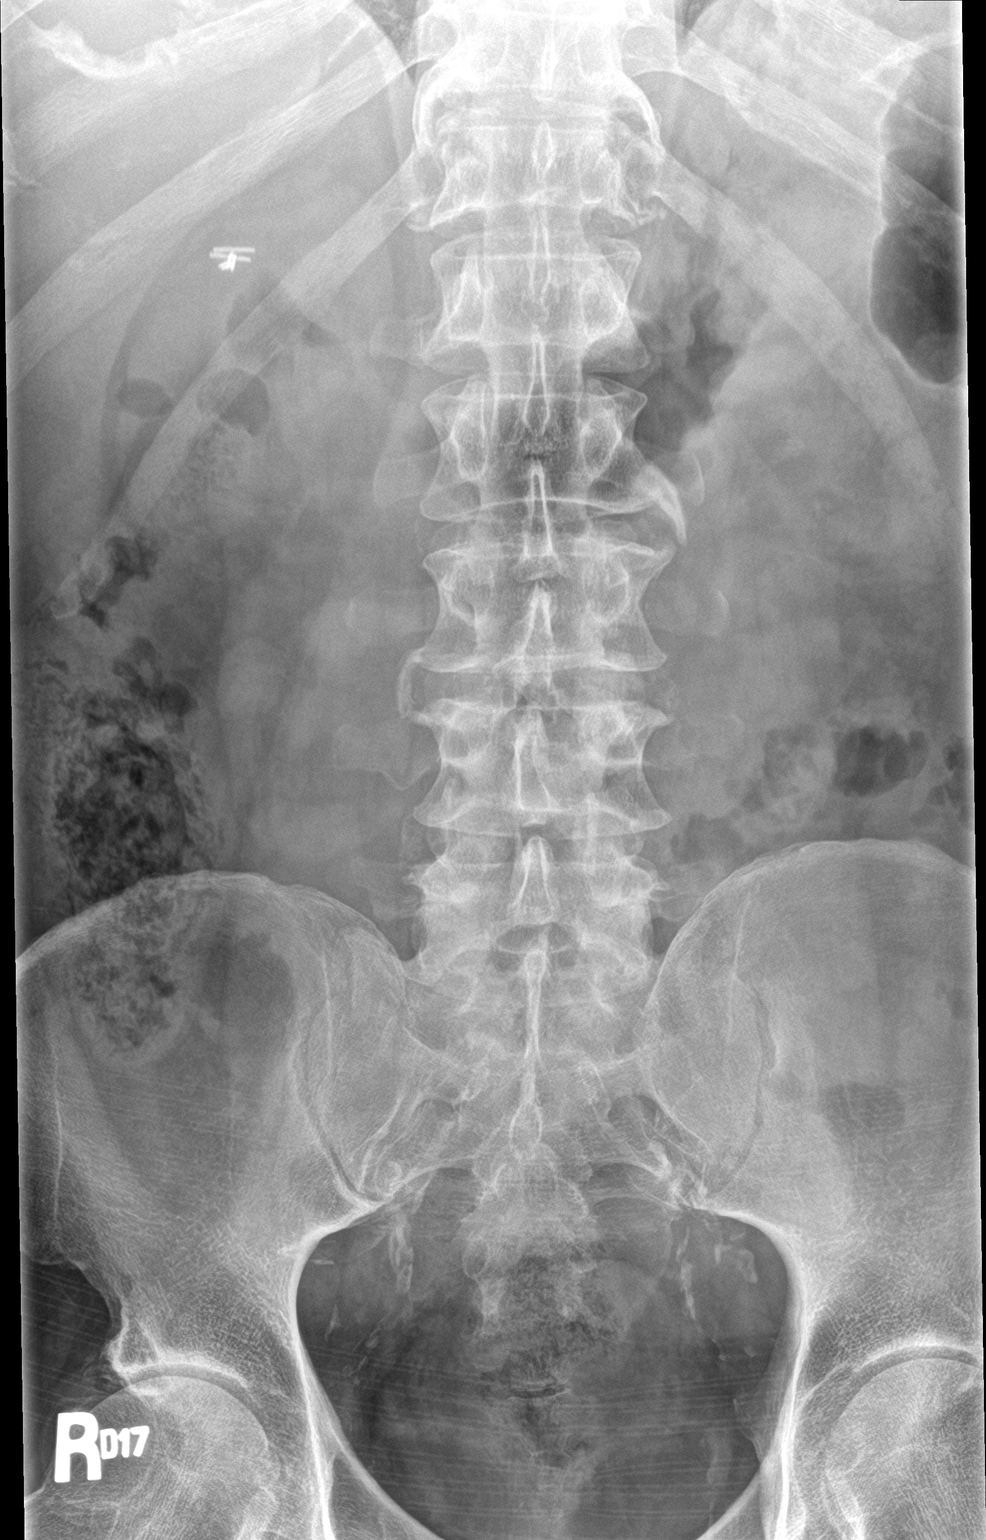

[l-spine lat]
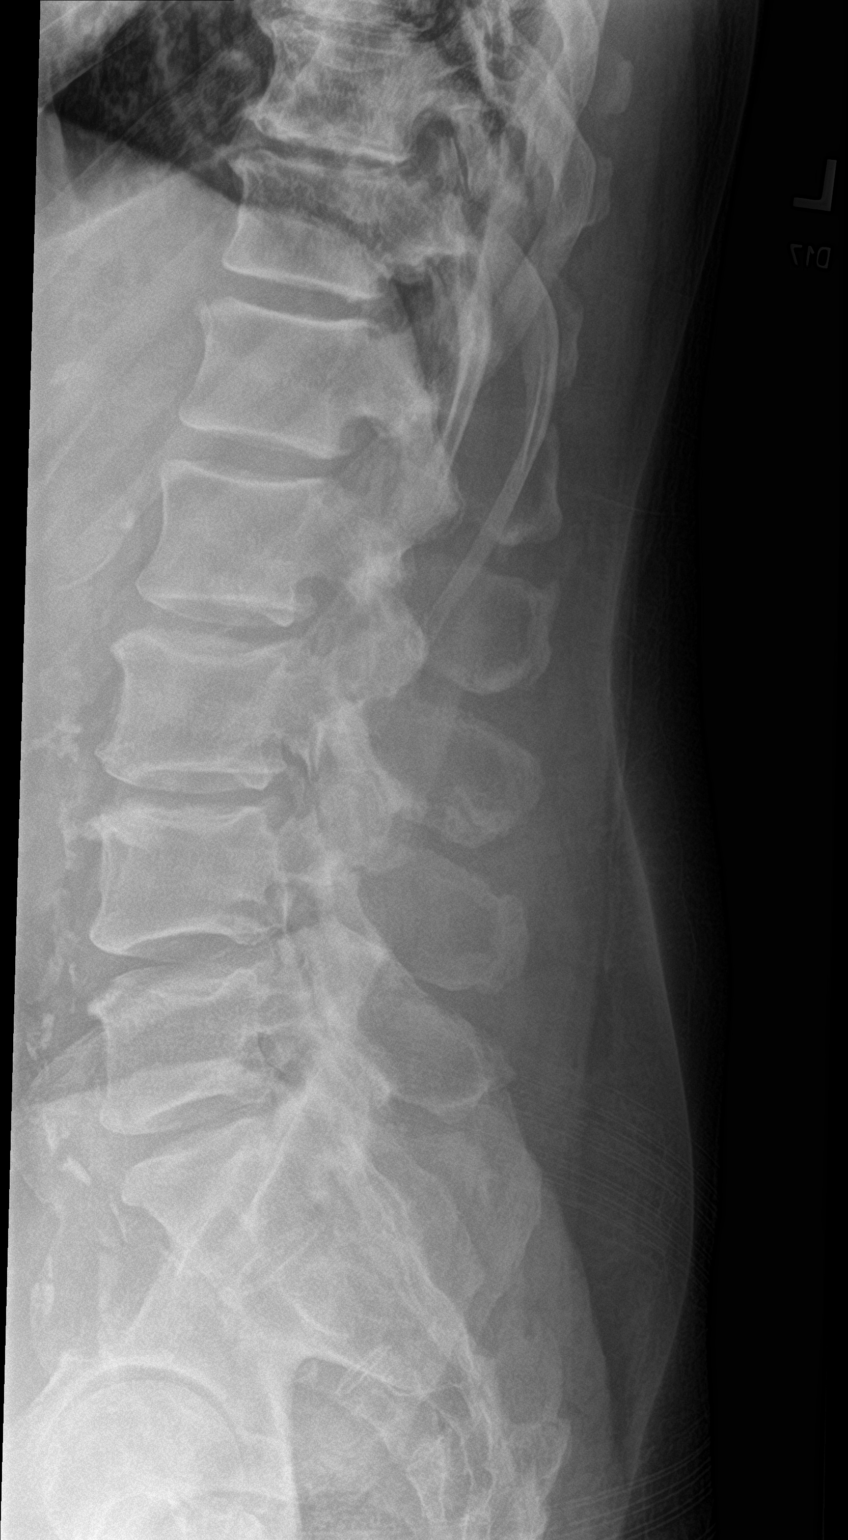

[l-spine spot]
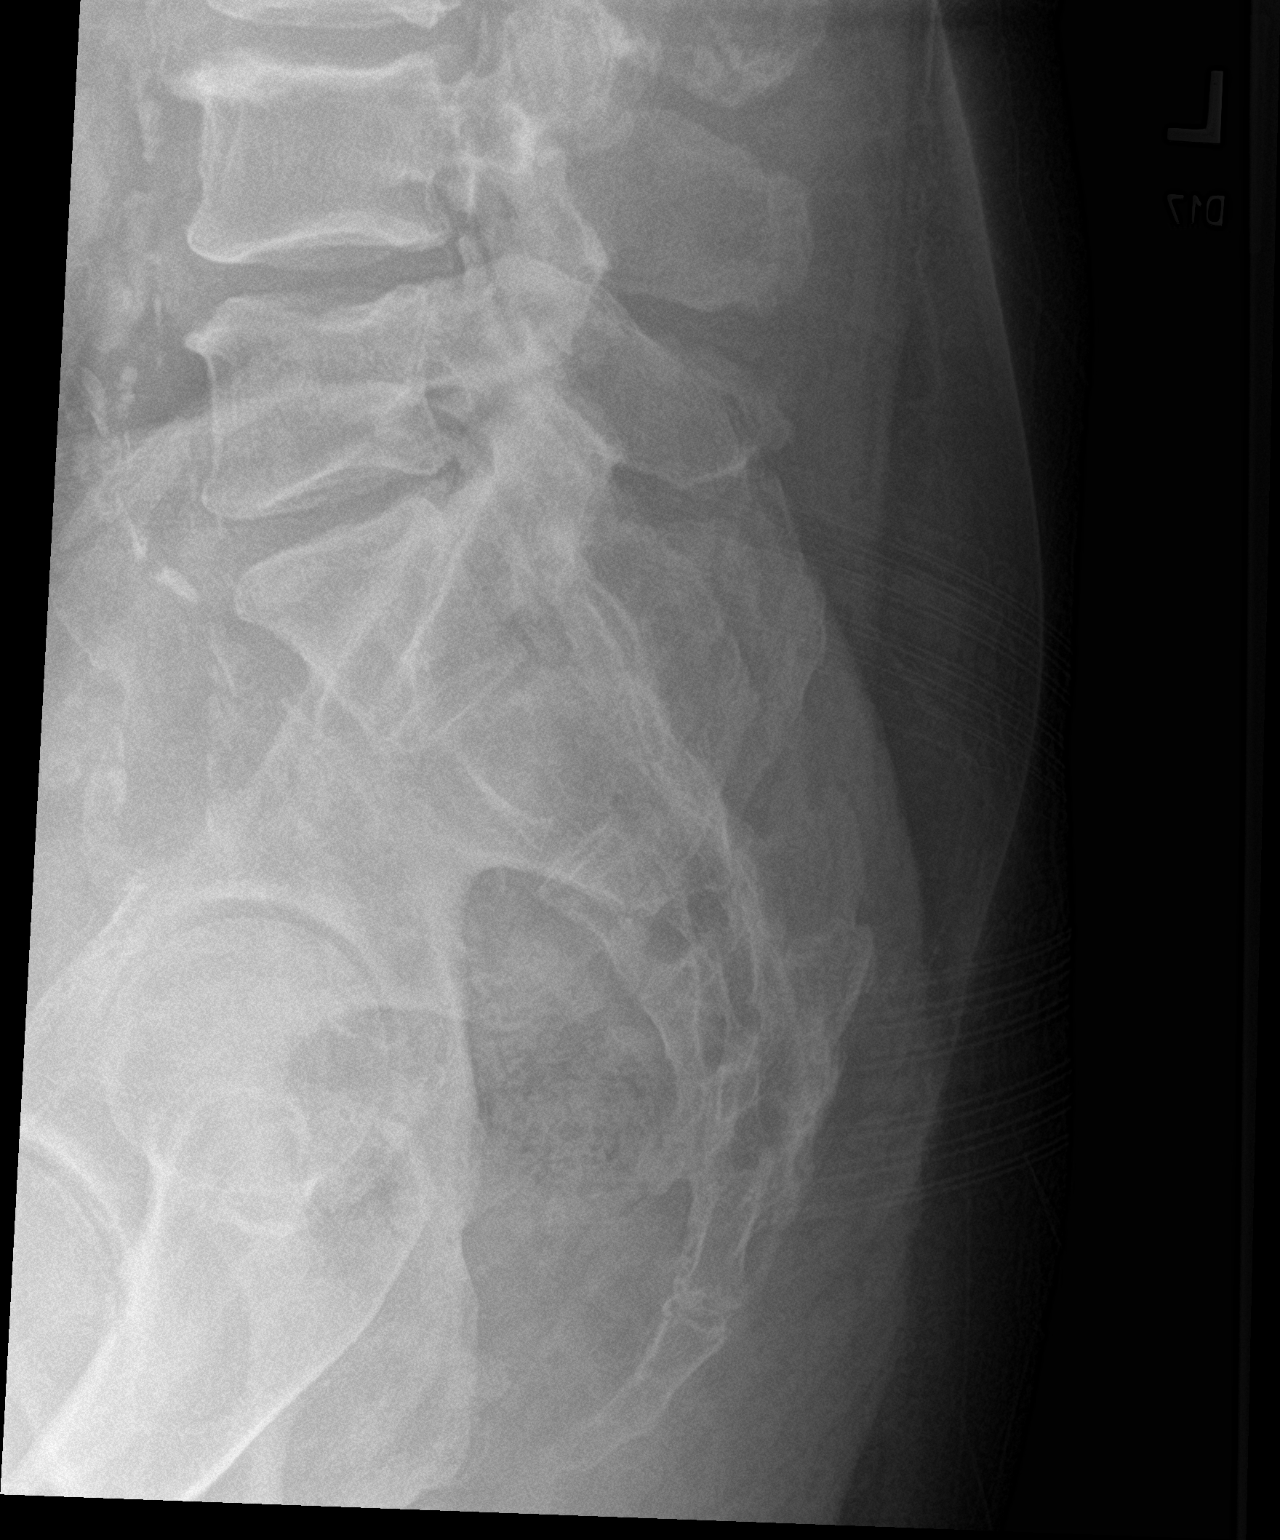

[3 of 3 positions shown; findings below may reference images not displayed]

FINDINGS: Straightening of normal lordosis. No listhesis. Multilevel endplate
spurring with mild disc space narrowing at L3-L4. There is facet
hypertrophy in the lower lumbar spine. Vertebral body heights are
preserved. No evidence of acute fracture. Sacroiliac joints are
congruent with minimal degenerative change. No evidence of focal
bone lesion. Aorto bi-iliac atherosclerosis is incidentally noted.
IMPRESSION: 1. No evidence of acute fracture or subluxation of the lumbar spine.
2. Multilevel degenerative change, most prominent at L3-L4.

## 2021-01-21 DIAGNOSIS — I10 Essential (primary) hypertension: Principal | ICD-10-CM

## 2021-01-21 MED ORDER — TAMSULOSIN 0.4 MG CAPSULE
ORAL_CAPSULE | Freq: Every day | ORAL | 3 refills | 90 days | Status: CP
Start: 2021-01-21 — End: 2022-01-21
  Filled 2021-01-26: qty 90, 90d supply, fill #0

## 2021-02-09 DIAGNOSIS — I10 Essential (primary) hypertension: Principal | ICD-10-CM

## 2021-02-09 MED ORDER — AMLODIPINE 10 MG TABLET
ORAL_TABLET | Freq: Every day | ORAL | 3 refills | 90 days | Status: CP
Start: 2021-02-09 — End: ?
  Filled 2021-02-11: qty 90, 90d supply, fill #0

## 2021-03-04 DIAGNOSIS — B182 Chronic viral hepatitis C: Principal | ICD-10-CM

## 2021-03-15 ENCOUNTER — Ambulatory Visit: Admit: 2021-03-15 | Payer: PRIVATE HEALTH INSURANCE | Attending: Family | Primary: Family

## 2021-12-20 ENCOUNTER — Other Ambulatory Visit: Payer: Self-pay | Admitting: Nurse Practitioner

## 2021-12-20 DIAGNOSIS — R160 Hepatomegaly, not elsewhere classified: Secondary | ICD-10-CM

## 2021-12-20 DIAGNOSIS — B1920 Unspecified viral hepatitis C without hepatic coma: Secondary | ICD-10-CM

## 2021-12-20 DIAGNOSIS — K7460 Unspecified cirrhosis of liver: Secondary | ICD-10-CM

## 2022-03-24 ENCOUNTER — Ambulatory Visit: Admission: RE | Admit: 2022-03-24 | Payer: Medicaid Other | Source: Ambulatory Visit

## 2022-04-04 ENCOUNTER — Ambulatory Visit: Admission: RE | Admit: 2022-04-04 | Payer: Medicaid Other | Source: Ambulatory Visit
